# Patient Record
Sex: Male | Born: 2000 | Race: White | Hispanic: No | Marital: Single | Smoking: Current every day smoker
Health system: Southern US, Community
[De-identification: ages and names within clinical notes are randomized; demographics above are authoritative.]

## PROBLEM LIST (undated history)

## (undated) DIAGNOSIS — F909 Attention-deficit hyperactivity disorder, unspecified type: Secondary | ICD-10-CM

## (undated) DIAGNOSIS — J45909 Unspecified asthma, uncomplicated: Secondary | ICD-10-CM

## (undated) HISTORY — PX: APPENDECTOMY: SHX54

## (undated) HISTORY — PX: OTHER SURGICAL HISTORY: SHX169

---
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
## 2018-06-10 IMAGING — CR DG CHEST 2V
1 series · 2 of 2 positions shown · non-contrast
Comparison: None.

CLINICAL DATA: Chest pain today.

EXAM:
CHEST - 2 VIEW

[Series 1: dg chest 2 view · 0.14mm/px · 2 of 2 slices shown]
[im 1/2]
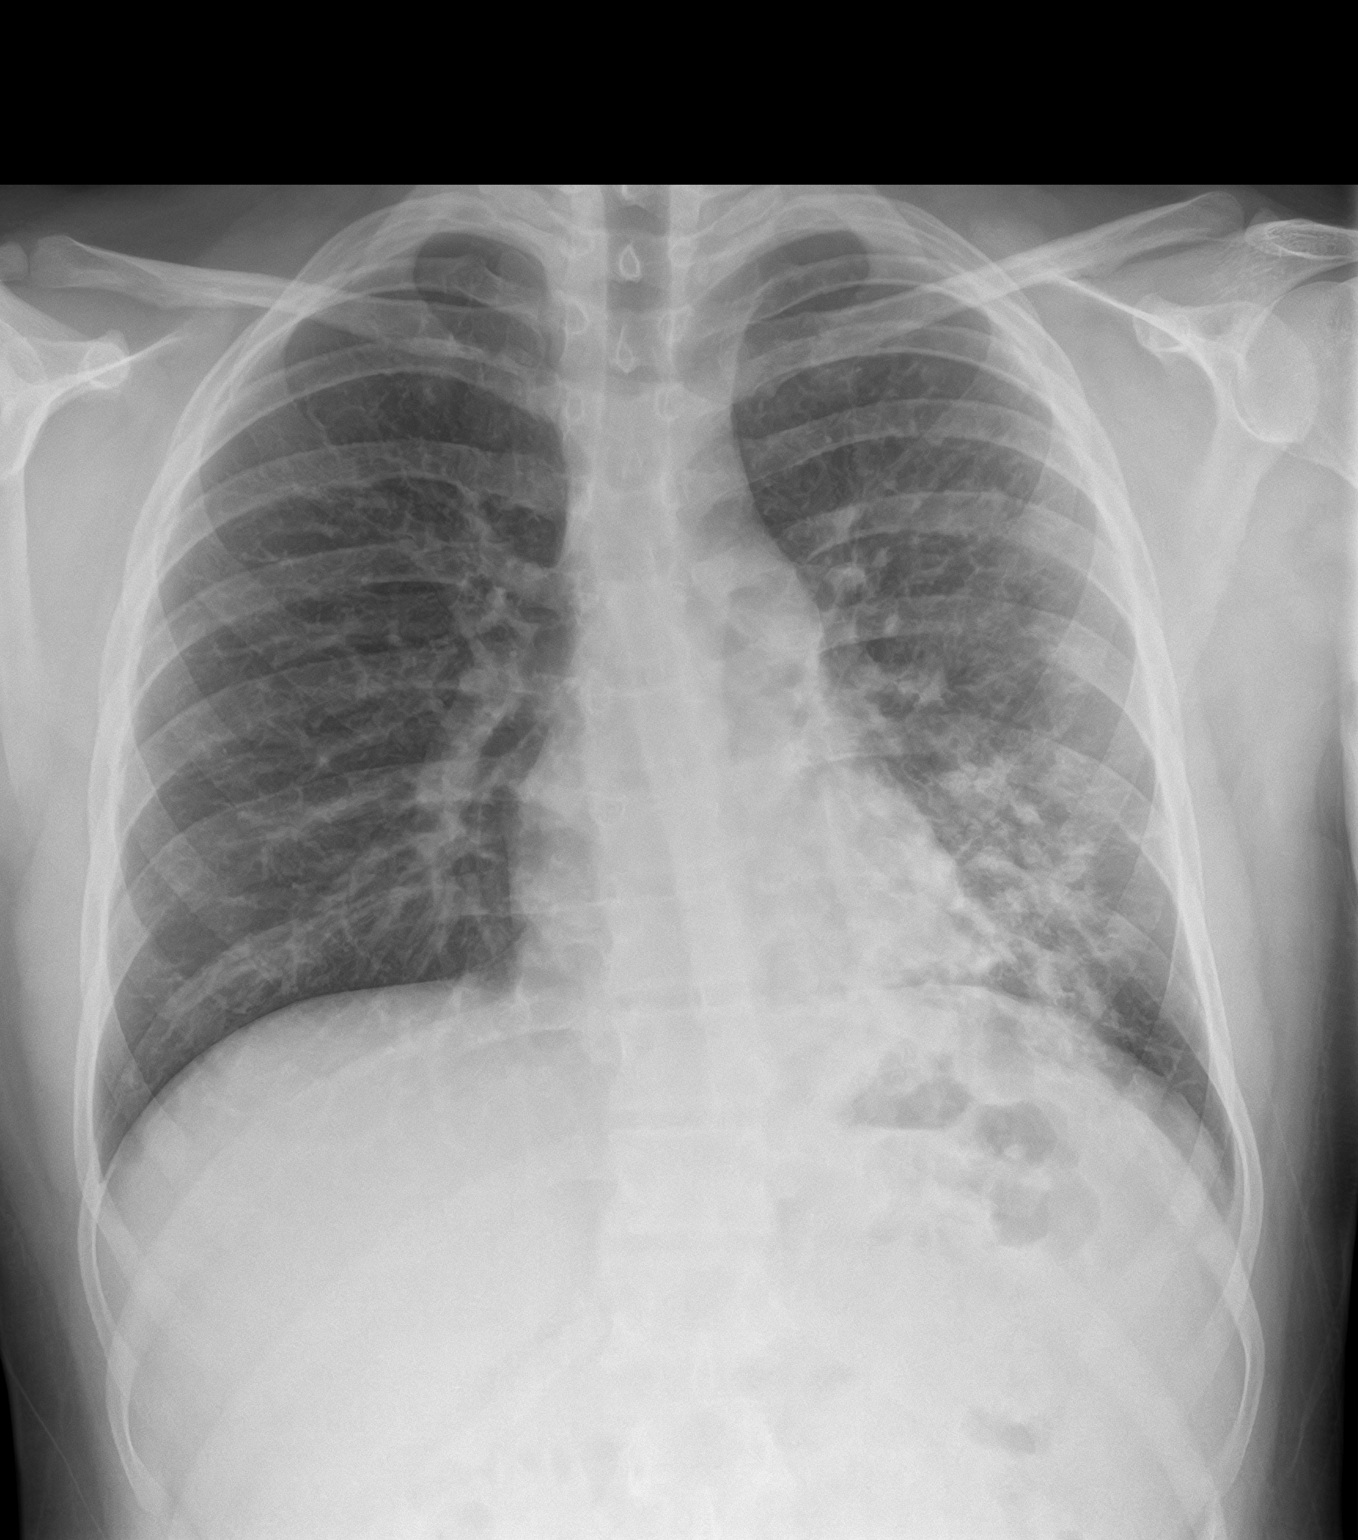
[im 2/2]
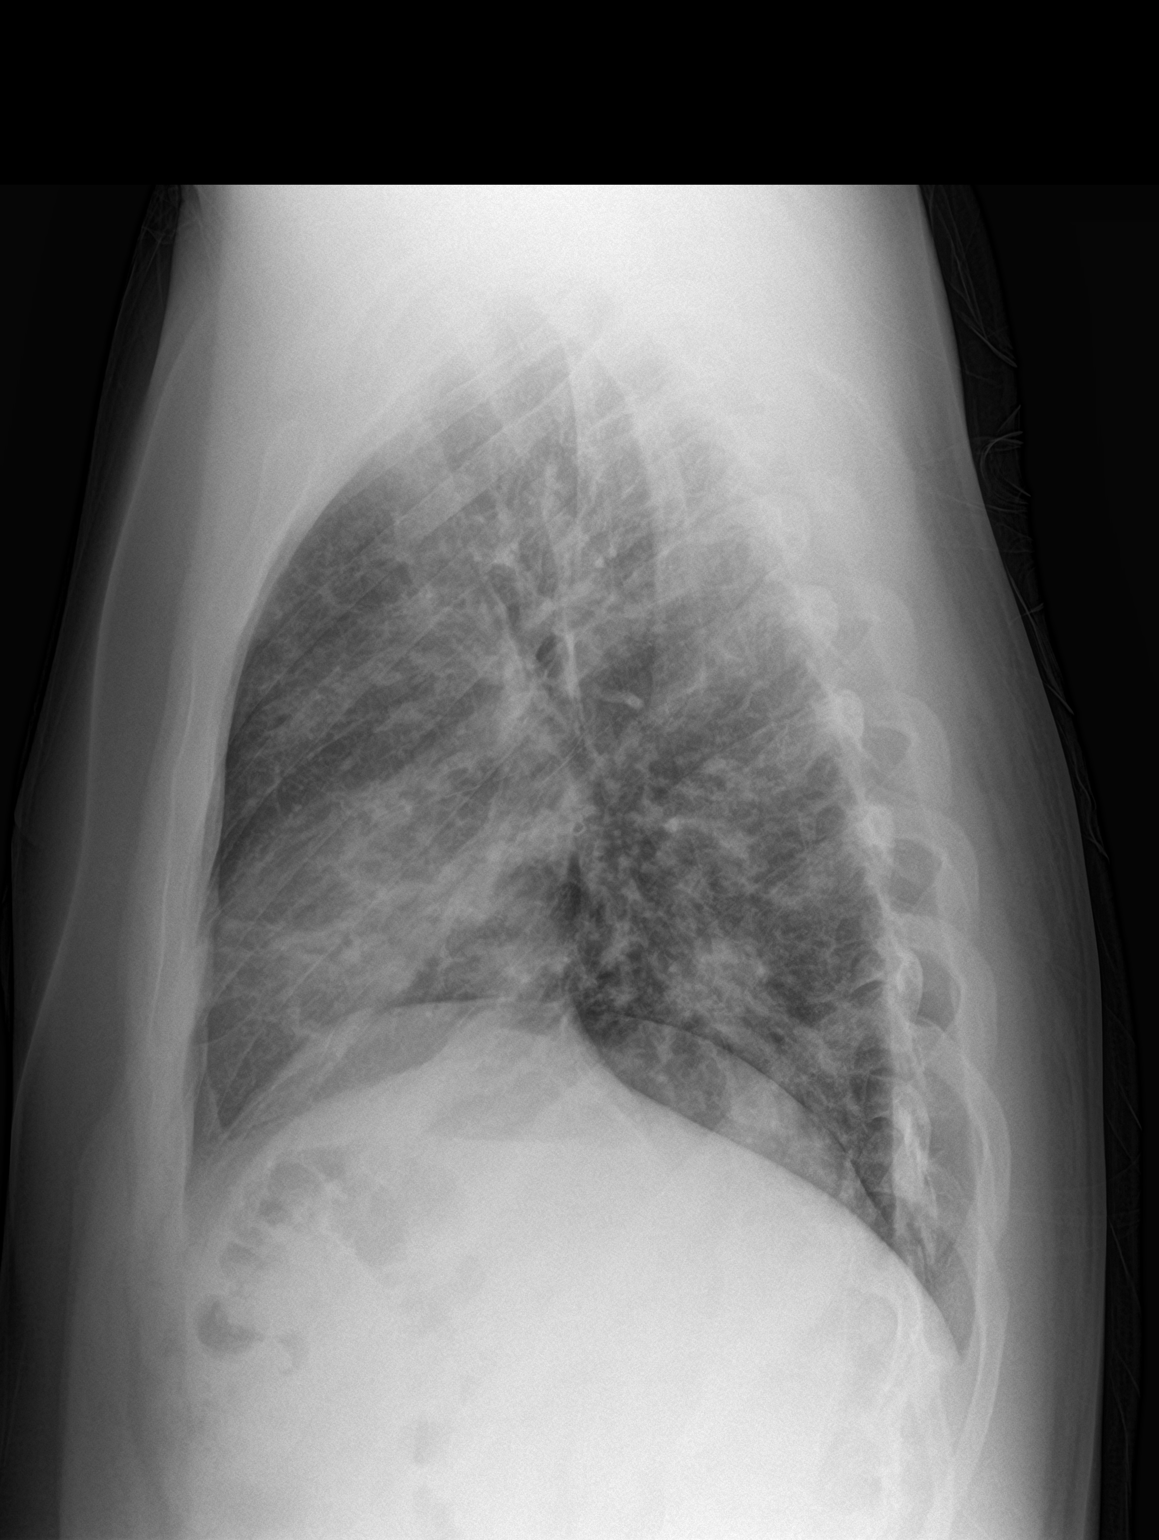

[2 of 2 positions shown; findings below may reference images not displayed]

FINDINGS: The patient has lingular and left lower lobe airspace disease. The
right lung is clear. Heart size is normal. No pneumothorax or
pleural effusion. No bony abnormality.
IMPRESSION: Lingular and left lower lobe airspace disease could be due to
aspiration although aspiration typically occurs in the right lung.
Pneumonia is also possible.

## 2018-08-18 ENCOUNTER — Other Ambulatory Visit: Payer: Self-pay

## 2018-08-18 ENCOUNTER — Emergency Department: Payer: Medicaid Other

## 2018-08-18 ENCOUNTER — Emergency Department
Admission: EM | Admit: 2018-08-18 | Discharge: 2018-08-18 | Disposition: A | Discharge status: Home / Self Care | Payer: Medicaid Other | Attending: Emergency Medicine | Admitting: Emergency Medicine

## 2018-08-18 ENCOUNTER — Encounter: Payer: Self-pay | Admitting: *Deleted

## 2018-08-18 DIAGNOSIS — J45909 Unspecified asthma, uncomplicated: Secondary | ICD-10-CM | POA: Diagnosis not present

## 2018-08-18 DIAGNOSIS — Z79899 Other long term (current) drug therapy: Secondary | ICD-10-CM | POA: Diagnosis not present

## 2018-08-18 DIAGNOSIS — F17210 Nicotine dependence, cigarettes, uncomplicated: Secondary | ICD-10-CM | POA: Insufficient documentation

## 2018-08-18 DIAGNOSIS — N50812 Left testicular pain: Secondary | ICD-10-CM

## 2018-08-18 DIAGNOSIS — N503 Cyst of epididymis: Secondary | ICD-10-CM | POA: Diagnosis not present

## 2018-08-18 NOTE — ED Triage Notes (Signed)
Pt reports swollen area to left testicle x 4 weeks .  Pt was seen at urgent care 2 weeks ago and started on doxycycline.  Pt states i'm not any better.  No diff urinating.  No penile discharge  Pt alert.

## 2018-08-18 NOTE — ED Notes (Signed)
Pt states swollen left testicle

## 2018-08-18 NOTE — Discharge Instructions (Addendum)
Follow-up with urologist if any pain, increasing size or failure resolution of left epididymal cyst.  Return to ER for any worsening symptoms or urgent changes in health.

## 2018-08-18 NOTE — ED Provider Notes (Signed)
[facility] EMERGENCY DEPARTMENT Provider Note   CSN: [id number] Arrival date & time: 08/18/18  1506    History   Chief Complaint Chief Complaint  Patient presents with  . Testicle Pain    HPI Garrett Baker is a 18 y.o. male.  Presents to the emergency department for evaluation of palpable cyst on the top of the left testicle.  He is noticed this for 2 weeks.  Denies any pain or discomfort.  Patient was diagnosed with epididymitis 2 weeks ago, started on doxycycline has not seen any resolution.  He did not have any testing for STDs.  He denies any dysuria, penile discharge, testicular pain, scrotal swelling.  No known contacts with STDs.    HPI  Past Medical History:  Diagnosis Date  . ADHD (attention deficit hyperactivity disorder)   . Asthma     There are no active problems to display for this patient.   Past Surgical History:  Procedure Laterality Date  . APPENDECTOMY    . tubes in ear          Home Medications    Prior to Admission medications   Medication Sig Start Date End Date Taking? Authorizing Provider  budesonide (ENTOCORT EC) 3 MG 24 hr capsule Take 3 mg by mouth daily.    [provider]  calcium carbonate (TUMS - DOSED IN MG ELEMENTAL CALCIUM) 500 MG chewable tablet Chew 1 tablet by mouth daily.    [provider]  esomeprazole (NEXIUM) 40 MG capsule Take 40 mg by mouth daily at 12 noon.    [provider]  nortriptyline (PAMELOR) 10 MG capsule Take 10 mg by mouth at bedtime.    [provider]  ondansetron (ZOFRAN) 4 MG tablet Take 4 mg by mouth every 8 (eight) hours as needed for nausea or vomiting.    [provider]    Family History No family history on file.  Social History Social History   Tobacco Use  . Smoking status: Current Every Day Smoker    Packs/day: 0.50    Types: Cigarettes  . Smokeless tobacco: Current User    Types: Snuff  Substance Use Topics  .  Alcohol use: No  . Drug use: Not on file     Allergies   Augmentin [amoxicillin-pot clavulanate]   Review of Systems Review of Systems  Constitutional: Negative for fever.  Genitourinary: Negative for decreased urine volume, difficulty urinating, discharge, dysuria, hematuria, penile pain, penile swelling, scrotal swelling and testicular pain.  Musculoskeletal: Negative for back pain.  Skin: Negative for rash and wound.     Physical Exam Updated Vital Signs BP 130/70 (BP Location: Right Arm)   Pulse 79   Temp 98.9 F (37.2 C) (Oral)   Resp 20   Ht 5\' 7"  (1.702 m)   Wt 95.3 kg   SpO2 99%   BMI 32.89 kg/m   Physical Exam Constitutional:      Appearance: He is well-developed.  HENT:     Head: Normocephalic and atraumatic.  Eyes:     Conjunctiva/sclera: Conjunctivae normal.  Neck:     Musculoskeletal: Normal range of motion.  Cardiovascular:     Rate and Rhythm: Normal rate.  Pulmonary:     Effort: Pulmonary effort is normal. No respiratory distress.  Genitourinary:    Comments: Penis shows no penile discharge, tenderness, ulcerations or cankers.  Patient with no scrotal swelling, tenderness.  No testicular tenderness.  Small palpable, nontender cyst along the top of the  left testicle Musculoskeletal: Normal range of motion.  Skin:    General: Skin is warm.     Findings: No rash.  Neurological:     Mental Status: He is alert and oriented to person, place, and time.  Psychiatric:        Behavior: Behavior normal.        Thought Content: Thought content normal.      ED Treatments / Results  Labs (all labs ordered are listed, but only abnormal results are displayed) Labs Reviewed - No data to display  EKG None  Radiology US Scrotum W/doppler  Result Date: 08/18/2018 CLINICAL DATA:  Left testicular swelling for 4 weeks. EXAM: SCROTAL ULTRASOUND DOPPLER ULTRASOUND OF THE TESTICLES TECHNIQUE: Complete ultrasound examination of the testicles, epididymis, and  other scrotal structures was performed. Color and spectral Doppler ultrasound were also utilized to evaluate blood flow to the testicles. COMPARISON:  None. FINDINGS: Right testicle Measurements: 4.4 x 2.4 x 1.7 cm. No mass or microlithiasis visualized. Left testicle Measurements: 4.2 x 2.4 x 1.8 cm. No mass or microlithiasis visualized. Right epididymis:  Normal in size and appearance. Left epididymis: Tiny epididymal cyst too small further characterize measuring approximately 2 x 3 x 3 mm. Hydrocele:  None visualized. Varicocele:  None visualized. Pulsed Doppler interrogation of both testes demonstrates normal low resistance arterial and venous waveforms bilaterally. IMPRESSION: No testicular mass or torsion. No hydrocele. Tiny epididymal cyst on the left measuring 2 x 3 x 3 mm. Electronically Signed   By: Tollie Eth M.D.   On: 08/18/2018 17:02    Procedures Procedures (including critical care time)  Medications Ordered in ED Medications - No data to display   Initial Impression / Assessment and Plan / ED Course  I have reviewed the triage vital signs and the nursing notes.  Pertinent labs & imaging results that were available during my care of the patient were reviewed by me and considered in my medical decision making (see chart for details).        18 year old male diagnosed with left testicle epididymal cyst.  Is educated on epididymal cyst and understand signs symptoms return to the ED for.  Is given information on epididymal cyst.  Will follow-up with urology as needed. Final Clinical Impressions(s) / ED Diagnoses   Final diagnoses:  Epididymal cyst    ED Discharge Orders    None       Ronnette Juniper 08/18/18 1753    Sharyn Creamer, MD 08/18/18 2112

## 2019-08-20 ENCOUNTER — Encounter: Payer: Self-pay | Admitting: Emergency Medicine

## 2019-08-20 ENCOUNTER — Ambulatory Visit
Admission: EM | Admit: 2019-08-20 | Discharge: 2019-08-20 | Disposition: A | Discharge status: Home / Self Care | Payer: Medicaid Other | Attending: Family Medicine | Admitting: Family Medicine

## 2019-08-20 ENCOUNTER — Other Ambulatory Visit: Payer: Self-pay

## 2019-08-20 DIAGNOSIS — F17210 Nicotine dependence, cigarettes, uncomplicated: Secondary | ICD-10-CM | POA: Diagnosis not present

## 2019-08-20 DIAGNOSIS — Z7952 Long term (current) use of systemic steroids: Secondary | ICD-10-CM | POA: Insufficient documentation

## 2019-08-20 DIAGNOSIS — J45909 Unspecified asthma, uncomplicated: Secondary | ICD-10-CM | POA: Diagnosis not present

## 2019-08-20 DIAGNOSIS — Z20822 Contact with and (suspected) exposure to covid-19: Secondary | ICD-10-CM | POA: Diagnosis not present

## 2019-08-20 DIAGNOSIS — J069 Acute upper respiratory infection, unspecified: Secondary | ICD-10-CM | POA: Diagnosis not present

## 2019-08-20 DIAGNOSIS — Z79899 Other long term (current) drug therapy: Secondary | ICD-10-CM | POA: Insufficient documentation

## 2019-08-20 DIAGNOSIS — R05 Cough: Secondary | ICD-10-CM | POA: Insufficient documentation

## 2019-08-20 NOTE — ED Provider Notes (Signed)
[facility]    CSN: [id number] Arrival date & time: 08/20/19  1549      History   Chief Complaint Chief Complaint  Patient presents with  . Cough    HPI Garrett Baker is a 19 y.o. male.   19 yo male with a c/o cough, nasal congestion, body aches for the past 3 days. Denies any fevers, chills, chest pain, shortness of breath, wheezing. No known sick contacts.   Cough   Past Medical History:  Diagnosis Date  . ADHD (attention deficit hyperactivity disorder)   . Asthma     There are no problems to display for this patient.   Past Surgical History:  Procedure Laterality Date  . APPENDECTOMY    . tubes in ear         Home Medications    Prior to Admission medications   Medication Sig Start Date End Date Taking? Authorizing Provider  albuterol (VENTOLIN HFA) 108 (90 Base) MCG/ACT inhaler Inhale into the lungs. 02/18/17  Yes [provider]  budesonide (ENTOCORT EC) 3 MG 24 hr capsule Take 3 mg by mouth daily.   Yes [provider]  fluticasone (FLOVENT HFA) 220 MCG/ACT inhaler Inhale into the lungs. 01/07/18  Yes [provider]  nortriptyline (PAMELOR) 10 MG capsule Take 10 mg by mouth at bedtime.   Yes [provider]  calcium carbonate (TUMS - DOSED IN MG ELEMENTAL CALCIUM) 500 MG chewable tablet Chew 1 tablet by mouth daily.    [provider]  esomeprazole (NEXIUM) 40 MG capsule Take 40 mg by mouth daily at 12 noon.    [provider]  ondansetron (ZOFRAN) 4 MG tablet Take 4 mg by mouth every 8 (eight) hours as needed for nausea or vomiting.    [provider]    Family History Family History  Problem Relation Age of Onset  . Healthy Mother   . Healthy Father     Social History Social History   Tobacco Use  . Smoking status: Current Every Day Smoker    Packs/day: 0.50    Types: Cigarettes  . Smokeless tobacco: Current User    Types: Snuff  Substance Use Topics  .  Alcohol use: No  . Drug use: Not on file     Allergies   Augmentin [amoxicillin-pot clavulanate]   Review of Systems Review of Systems  Respiratory: Positive for cough.      Physical Exam Triage Vital Signs ED Triage Vitals  Enc Vitals Group     BP 08/20/19 1601 135/81     Pulse Rate 08/20/19 1601 (!) 113     Resp 08/20/19 1601 16     Temp 08/20/19 1601 98.5 F (36.9 C)     Temp Source 08/20/19 1601 Oral     SpO2 08/20/19 1601 97 %     Weight 08/20/19 1558 215 lb (97.5 kg)     Height 08/20/19 1558 5\' 7"  (1.702 m)     Head Circumference --      Peak Flow --      Pain Score 08/20/19 1558 4     Pain Loc --      Pain Edu? --      Excl. in [place]? --    No data found.  Updated Vital Signs BP 135/81 (BP Location: Left Arm)   Pulse (!) 113   Temp 98.5 F (36.9 C) (Oral)   Resp 16   Ht 5\' 7"  (1.702 m)   Wt 97.5 kg  SpO2 97%   BMI 33.67 kg/m   Visual Acuity Right Eye Distance:   Left Eye Distance:   Bilateral Distance:    Right Eye Near:   Left Eye Near:    Bilateral Near:     Physical Exam Vitals and nursing note reviewed.  Constitutional:      General: He is not in acute distress.    Appearance: He is not toxic-appearing or diaphoretic.  HENT:     Mouth/Throat:     Pharynx: Posterior oropharyngeal erythema present. No oropharyngeal exudate.  Cardiovascular:     Rate and Rhythm: Tachycardia present.  Pulmonary:     Effort: Pulmonary effort is normal. No respiratory distress.     Breath sounds: Normal breath sounds. No stridor. No wheezing, rhonchi or rales.  Neurological:     Mental Status: He is alert.      UC Treatments / Results  Labs (all labs ordered are listed, but only abnormal results are displayed) Labs Reviewed  NOVEL CORONAVIRUS, NAA (HOSP ORDER, SEND-OUT TO REF LAB; TAT 18-24 HRS)    EKG   Radiology No results found.  Procedures Procedures (including critical care time)  Medications Ordered in UC Medications - No data to  display  Initial Impression / Assessment and Plan / UC Course  I have reviewed the triage vital signs and the nursing notes.  Pertinent labs & imaging results that were available during my care of the patient were reviewed by me and considered in my medical decision making (see chart for details).      Final Clinical Impressions(s) / UC Diagnoses   Final diagnoses:  Viral URI with cough     Discharge Instructions     Rest, fluids, over the counter cold/cough medications as needed    ED Prescriptions    None     1.  diagnosis reviewed with patient 2. Recommend supportive treatment as above 3. covid test done 4. Follow-up prn if symptoms worsen or don't improve   PDMP not reviewed this encounter.   Payton Mccallum, MD 08/20/19 [phone]

## 2019-08-20 NOTE — Discharge Instructions (Signed)
Rest, fluids, over the counter cold/cough medications as needed °

## 2019-08-20 NOTE — ED Triage Notes (Signed)
Patient c/o HAs, cough, and congestion that started on Thursday.  Patient denies fevers.

## 2019-08-21 LAB — NOVEL CORONAVIRUS, NAA (HOSP ORDER, SEND-OUT TO REF LAB; TAT 18-24 HRS): SARS-CoV-2, NAA: NOT DETECTED

## 2020-11-24 IMAGING — US US SCROTUM W/ DOPPLER COMPLETE
1 series · 14 of 25 positions shown · non-contrast
Comparison: None.

CLINICAL DATA: Left testicular swelling for 4 weeks.

EXAM:
SCROTAL ULTRASOUND
DOPPLER ULTRASOUND OF THE TESTICLES
TECHNIQUE: Complete ultrasound examination of the testicles, epididymis, and
other scrotal structures was performed. Color and spectral Doppler
ultrasound were also utilized to evaluate blood flow to the
testicles.

[Series 1: us scrotum w/ doppler complete · 0.07mm/px · 14 of 50 slices shown]
[im 1/50]
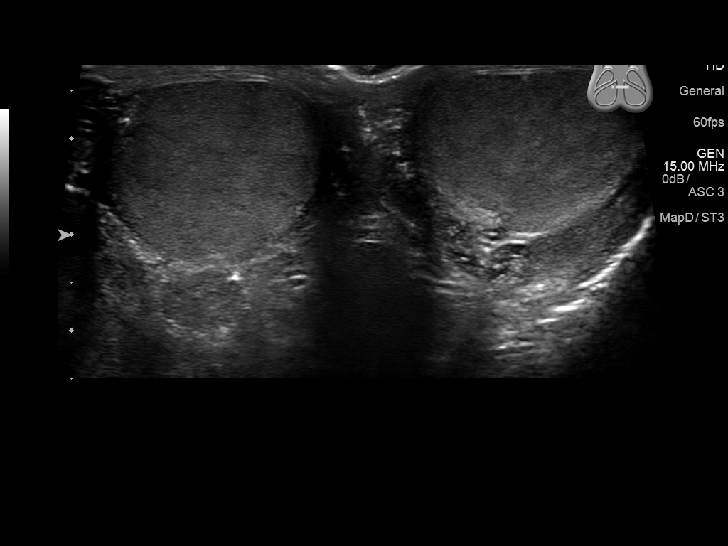
[im 5/50]
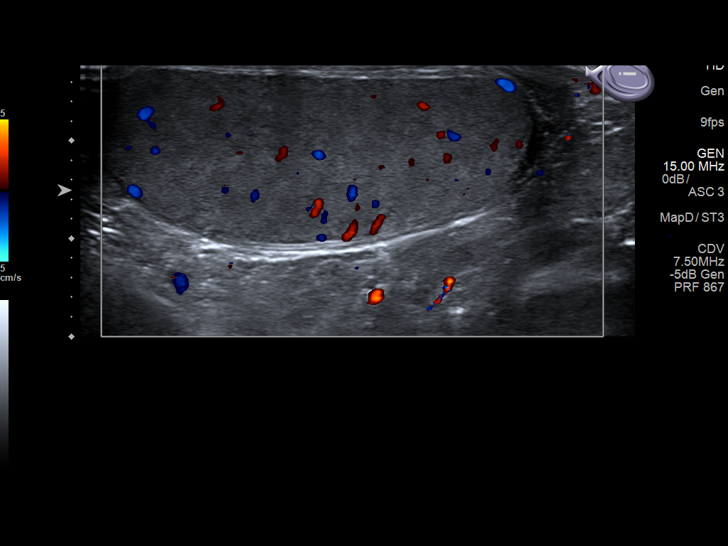
[im 9/50]
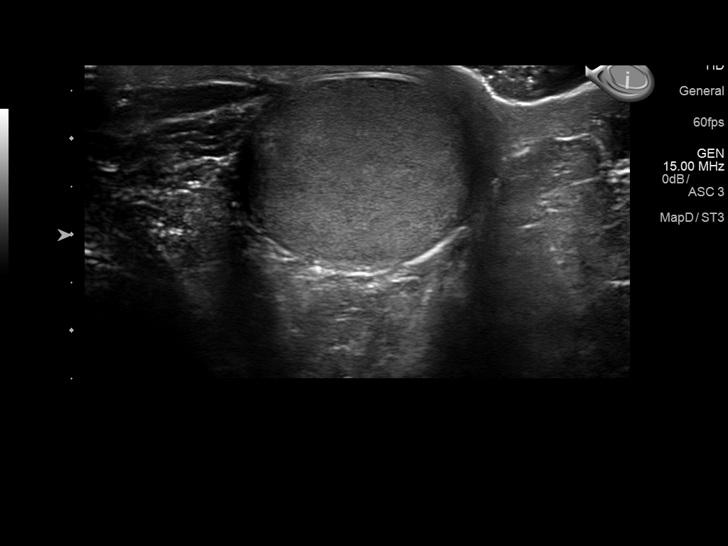
[im 13/50]
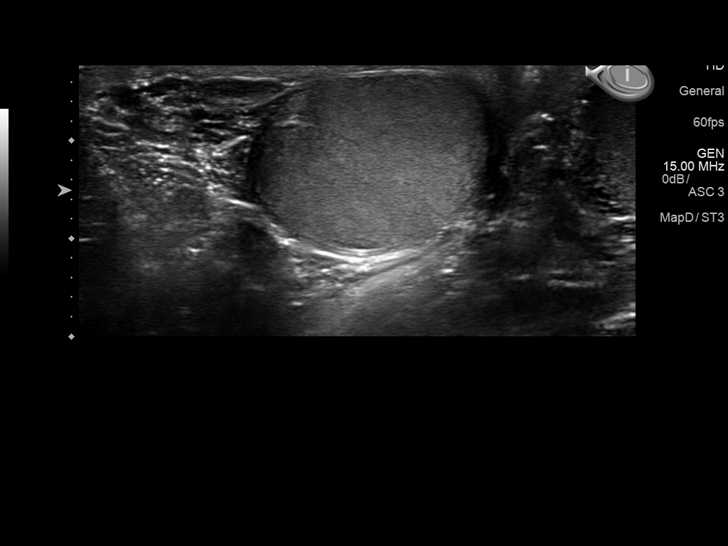
[im 17/50]
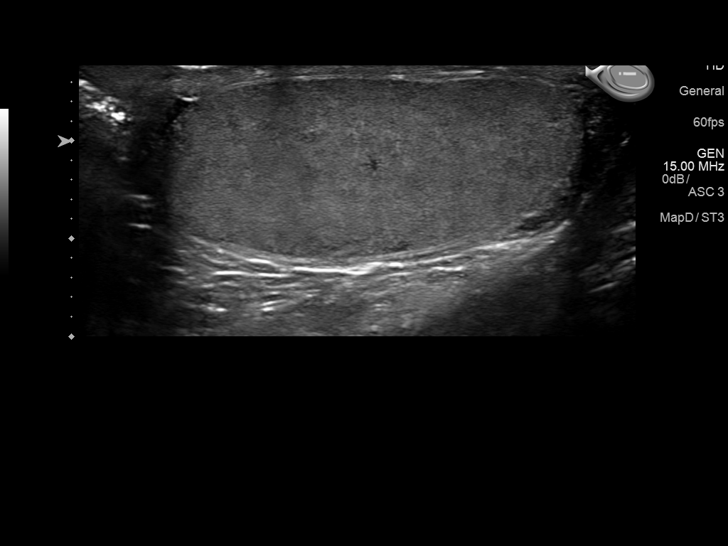
[im 19/50]
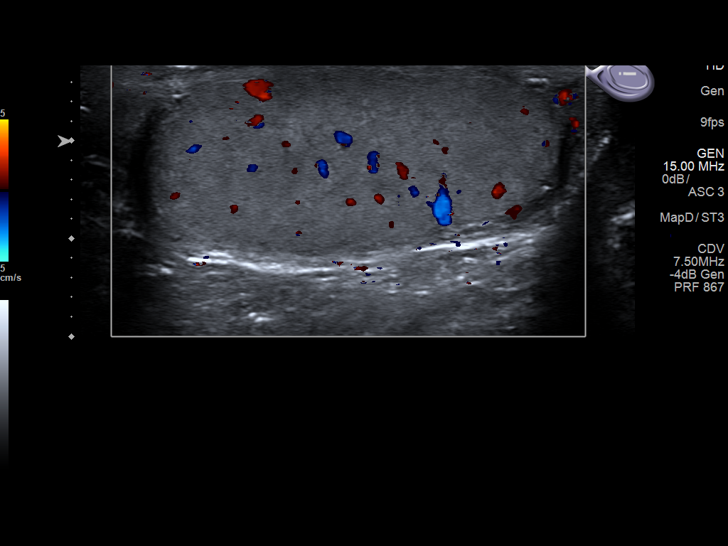
[im 23/50]
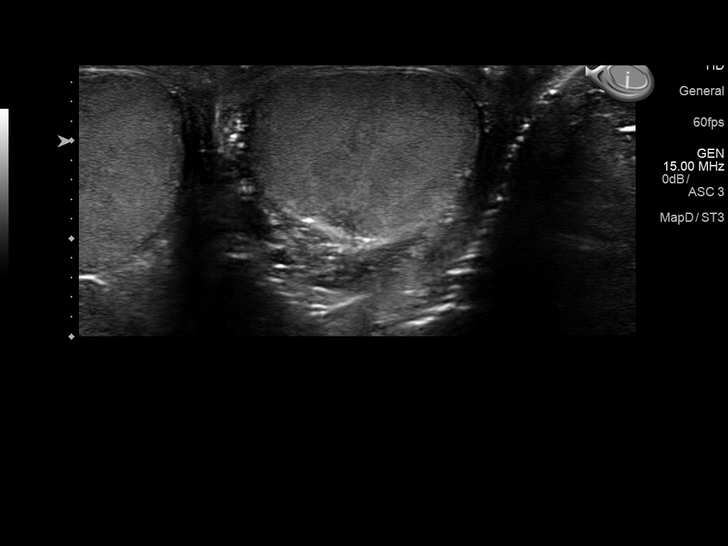
[im 27/50]
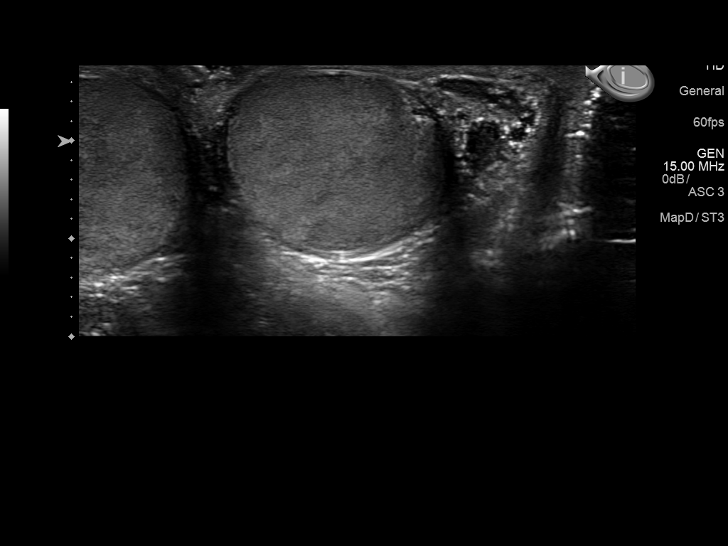
[im 31/50]
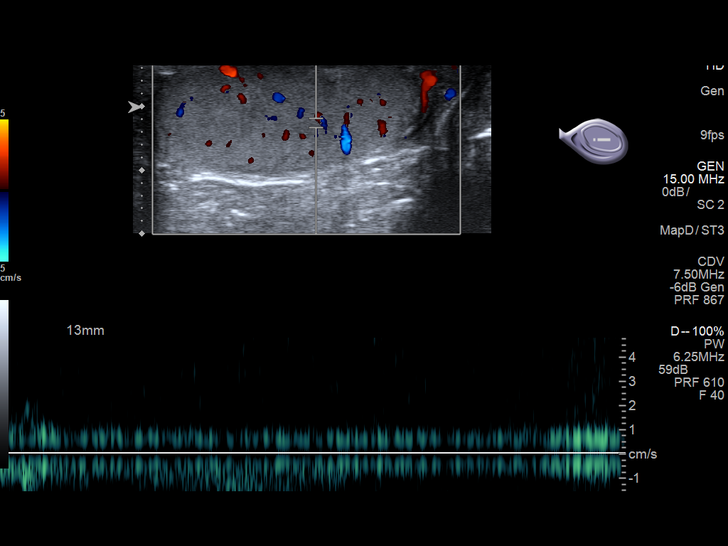
[im 33/50]
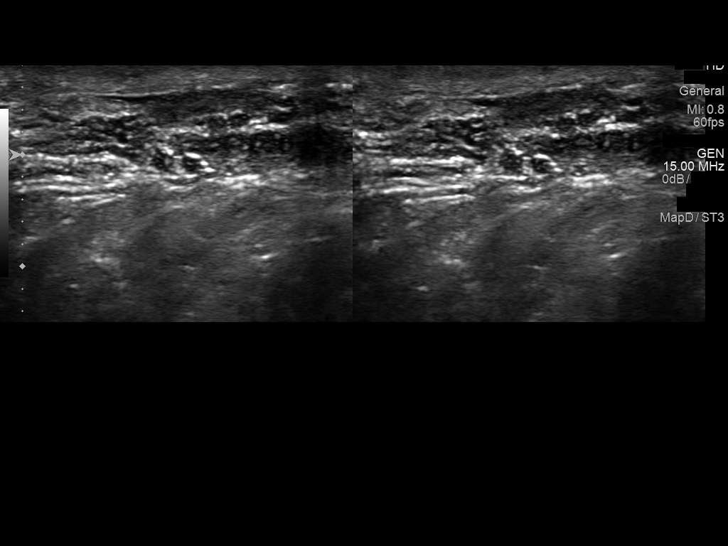
[im 37/50]
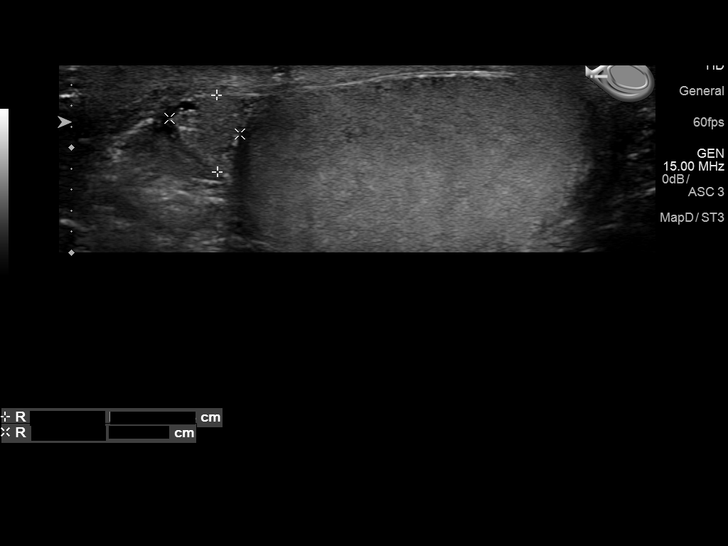
[im 41/50]
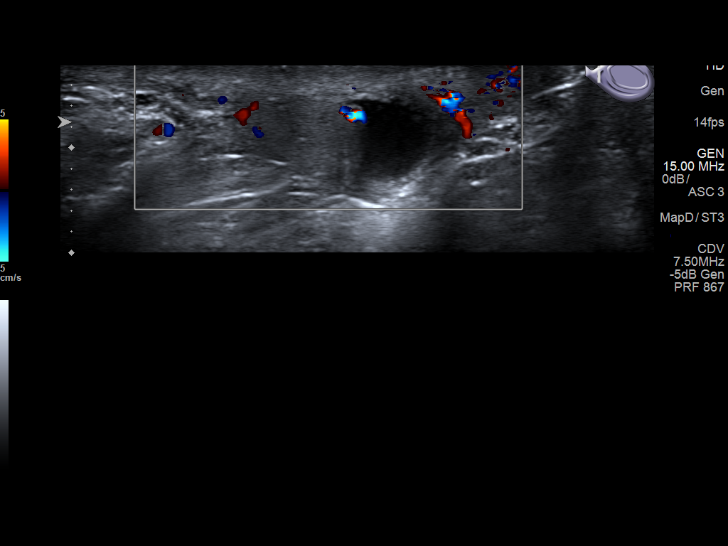
[im 45/50]
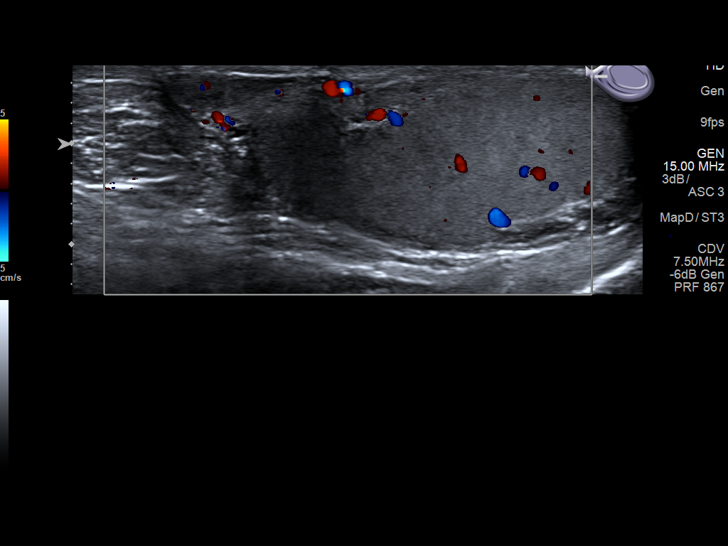
[im 50/50]
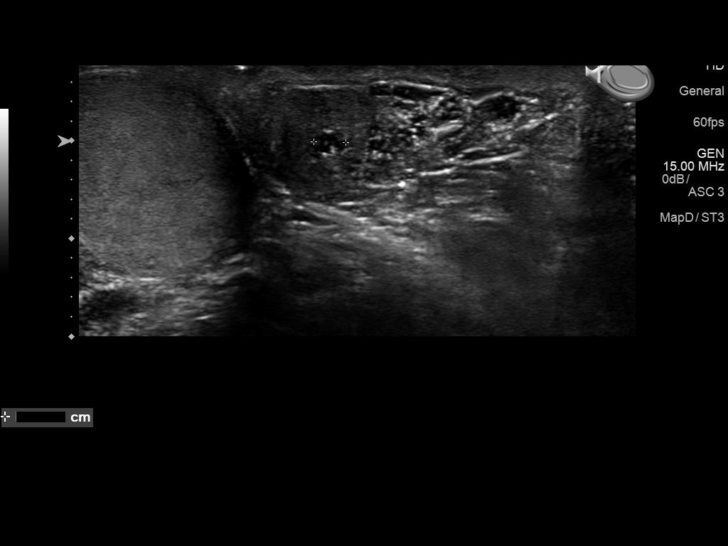

[14 of 25 positions shown; findings below may reference images not displayed]

FINDINGS: Right testicle

Measurements: 4.4 x 2.4 x 1.7 cm. No mass or microlithiasis
visualized.

Left testicle

Measurements: 4.2 x 2.4 x 1.8 cm. No mass or microlithiasis
visualized.

Right epididymis:  Normal in size and appearance.

Left epididymis: Tiny epididymal cyst too small further characterize
measuring approximately 2 x 3 x 3 mm.

Hydrocele:  None visualized.

Varicocele:  None visualized.

Pulsed Doppler interrogation of both testes demonstrates normal low
resistance arterial and venous waveforms bilaterally.
IMPRESSION: No testicular mass or torsion. No hydrocele. Tiny epididymal cyst on
the left measuring 2 x 3 x 3 mm.

## 2020-12-20 ENCOUNTER — Other Ambulatory Visit: Payer: Self-pay

## 2020-12-20 ENCOUNTER — Ambulatory Visit
Admission: EM | Admit: 2020-12-20 | Discharge: 2020-12-20 | Disposition: A | Discharge status: Home / Self Care | Payer: Medicaid Other | Attending: Family Medicine | Admitting: Family Medicine

## 2020-12-20 DIAGNOSIS — U071 COVID-19: Secondary | ICD-10-CM | POA: Diagnosis not present

## 2020-12-20 LAB — URINALYSIS, COMPLETE (UACMP) WITH MICROSCOPIC
Bacteria, UA: NONE SEEN
Glucose, UA: NEGATIVE mg/dL
Ketones, ur: 15 mg/dL — AB
Leukocytes,Ua: NEGATIVE
Nitrite: NEGATIVE
Protein, ur: 100 mg/dL — AB
Specific Gravity, Urine: >1.03 — ABNORMAL HIGH (ref 1.005–1.030)
pH: 6 (ref 5.0–8.0)

## 2020-12-20 LAB — RESP PANEL BY RT-PCR (FLU A&B, COVID) ARPGX2
Influenza A by PCR: NEGATIVE
Influenza B by PCR: NEGATIVE
SARS Coronavirus 2 by RT PCR: POSITIVE — AB

## 2020-12-20 MED ORDER — MOLNUPIRAVIR EUA 200MG CAPSULE: 4.0000 | ORAL_CAPSULE | Freq: Two times a day (BID) | ORAL | 0 refills | Status: AC

## 2020-12-20 NOTE — Discharge Instructions (Addendum)
You have COVID.  Rest, fluids.  Medication as directed.  Stay home. Anne Shutter is for 5 days.  If you worsen, go to the ER.

## 2020-12-20 NOTE — ED Provider Notes (Signed)
[facility]    CSN: [id number] Arrival date & time: 12/20/20  1210   History   Chief Complaint Chief Complaint  Patient presents with   Headache   Dizziness   HPI 20 year old male presents with multiple complaints.  Symptoms started yesterday. He reports headache, cough, body aches, dizzy, fatigue, sore throat. He has had subjective fever. He has also had some dysuria. Pain 9/10 in severity.  No relieving factors.  No reported sick contacts.  No other complaints.   Past Medical History:  Diagnosis Date   ADHD (attention deficit hyperactivity disorder)    Asthma    Past Surgical History:  Procedure Laterality Date   APPENDECTOMY     tubes in ear     Home Medications    Prior to Admission medications   Medication Sig Start Date End Date Taking? Authorizing Provider  albuterol (VENTOLIN HFA) 108 (90 Base) MCG/ACT inhaler Inhale into the lungs. 02/18/17  Yes [provider]  budesonide (ENTOCORT EC) 3 MG 24 hr capsule Take 3 mg by mouth daily.   Yes [provider]  fluticasone (FLOVENT HFA) 220 MCG/ACT inhaler Inhale into the lungs. 01/07/18  Yes [provider]  molnupiravir EUA 200 mg CAPS Take 4 capsules (800 mg total) by mouth 2 (two) times daily for 5 days. 12/20/20 12/25/20 Yes Mazzie Brodrick G, DO  nortriptyline (PAMELOR) 10 MG capsule Take 10 mg by mouth at bedtime.   Yes [provider]  ondansetron (ZOFRAN) 4 MG tablet Take 4 mg by mouth every 8 (eight) hours as needed for nausea or vomiting.   Yes [provider]    Family History Family History  Problem Relation Age of Onset   Healthy Mother    Healthy Father     Social History Social History   Tobacco Use   Smoking status: Every Day    Packs/day: 0.50    Pack years: 0.00    Types: Cigarettes   Smokeless tobacco: Current    Types: Snuff  Vaping Use   Vaping Use: Never used  Substance Use Topics   Alcohol use: No     Allergies   Augmentin  [amoxicillin-pot clavulanate]   Review of Systems Review of Systems Per HPI  Physical Exam Triage Vital Signs ED Triage Vitals  Enc Vitals Group     BP 12/20/20 1231 103/84     Pulse Rate 12/20/20 1231 98     Resp 12/20/20 1231 18     Temp 12/20/20 1231 98.9 F (37.2 C)     Temp Source 12/20/20 1231 Oral     SpO2 12/20/20 1231 97 %     Weight 12/20/20 1227 220 lb (99.8 kg)     Height 12/20/20 1227 5\' 7"  (1.702 m)     Head Circumference --      Peak Flow --      Pain Score 12/20/20 1227 9     Pain Loc --      Pain Edu? --      Excl. in [place]? --     Updated Vital Signs BP 103/84 (BP Location: Left Arm)   Pulse 98   Temp 98.9 F (37.2 C) (Oral)   Resp 18   Ht 5\' 7"  (1.702 m)   Wt 99.8 kg   SpO2 97%   BMI 34.46 kg/m   Visual Acuity Right Eye Distance:   Left Eye Distance:   Bilateral Distance:    Right Eye Near:   Left Eye  Near:    Bilateral Near:     Physical Exam Constitutional:      General: He is not in acute distress.    Appearance: Normal appearance.  HENT:     Head: Normocephalic and atraumatic.     Mouth/Throat:     Pharynx: Oropharynx is clear.  Eyes:     General:        Right eye: No discharge.        Left eye: No discharge.     Conjunctiva/sclera: Conjunctivae normal.  Cardiovascular:     Rate and Rhythm: Normal rate and regular rhythm.  Pulmonary:     Effort: Pulmonary effort is normal.     Breath sounds: Normal breath sounds. No wheezing, rhonchi or rales.  Neurological:     Mental Status: He is alert.  Psychiatric:        Mood and Affect: Mood normal.        Behavior: Behavior normal.     UC Treatments / Results  Labs (all labs ordered are listed, but only abnormal results are displayed) Labs Reviewed  RESP PANEL BY RT-PCR (FLU A&B, COVID) ARPGX2 - Abnormal; Notable for the following components:      Result Value   SARS Coronavirus 2 by RT PCR POSITIVE (*)    All other components within normal limits  URINALYSIS, COMPLETE  (UACMP) WITH MICROSCOPIC - Abnormal; Notable for the following components:   Specific Gravity, Urine >1.030 (*)    Hgb urine dipstick TRACE (*)    Bilirubin Urine SMALL (*)    Ketones, ur 15 (*)    Protein, ur 100 (*)    All other components within normal limits    EKG   Radiology No results found.  Procedures Procedures (including critical care time)  Medications Ordered in UC Medications - No data to display  Initial Impression / Assessment and Plan / UC Course  I have reviewed the triage vital signs and the nursing notes.  Pertinent labs & imaging results that were available during my care of the patient were reviewed by me and considered in my medical decision making (see chart for details).    20 year old presents with COVID-19. Acute illness with systemic symptoms.  Patient has risk factor of asthma as well as obesity.  Treating with molnupiravir.   Final Clinical Impressions(s) / UC Diagnoses   Final diagnoses:  COVID     Discharge Instructions      You have COVID.  Rest, fluids.  Medication as directed.  Stay home. Anne Shutter is for 5 days.  If you worsen, go to the ER.    ED Prescriptions     Medication Sig Dispense Auth. Provider   molnupiravir EUA 200 mg CAPS Take 4 capsules (800 mg total) by mouth 2 (two) times daily for 5 days. 40 capsule Tommie Sams, DO      PDMP not reviewed this encounter.   Tommie Sams, [place] 12/20/20 1801

## 2020-12-20 NOTE — ED Triage Notes (Addendum)
Pt c/o headache, dizziness, diarrhea, cough and sore throat since yesterday. Pt also reports loss of appetite and increased sleeping. Pt denies n/v or other symptoms, thinks he may have had a slight fever last night. Pt also reports some dysuria yesterday, improved today.

## 2022-10-28 ENCOUNTER — Other Ambulatory Visit: Payer: Self-pay

## 2022-10-28 ENCOUNTER — Other Ambulatory Visit: Payer: Self-pay | Admitting: Family Medicine

## 2022-10-28 ENCOUNTER — Ambulatory Visit
Admission: RE | Admit: 2022-10-28 | Discharge: 2022-10-28 | Disposition: A | Discharge status: Home / Self Care | Payer: Medicaid Other | Source: Ambulatory Visit | Attending: Family Medicine | Admitting: Family Medicine

## 2022-10-28 DIAGNOSIS — R519 Headache, unspecified: Secondary | ICD-10-CM
